# Patient Record
Sex: Male | Born: 1980 | Race: White | Hispanic: No | Marital: Single | Smoking: Current every day smoker
Health system: Southern US, Community
[De-identification: ages and names within clinical notes are randomized; demographics above are authoritative.]

---
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
## 2002-07-04 ENCOUNTER — Emergency Department (HOSPITAL_COMMUNITY): Admission: EM | Admit: 2002-07-04 | Discharge: 2002-07-05 | Payer: Self-pay | Admitting: *Deleted

## 2002-07-05 ENCOUNTER — Encounter: Payer: Self-pay | Admitting: *Deleted

## 2008-09-02 ENCOUNTER — Emergency Department (HOSPITAL_COMMUNITY): Admission: EM | Admit: 2008-09-02 | Discharge: 2008-09-03 | Payer: Self-pay | Admitting: Emergency Medicine

## 2009-01-26 ENCOUNTER — Emergency Department (HOSPITAL_COMMUNITY): Admission: EM | Admit: 2009-01-26 | Discharge: 2009-01-26 | Payer: Self-pay | Admitting: Emergency Medicine

## 2009-01-30 ENCOUNTER — Emergency Department (HOSPITAL_COMMUNITY): Admission: EM | Admit: 2009-01-30 | Discharge: 2009-01-30 | Payer: Self-pay | Admitting: Emergency Medicine

## 2010-02-16 ENCOUNTER — Emergency Department (HOSPITAL_BASED_OUTPATIENT_CLINIC_OR_DEPARTMENT_OTHER)
Admission: EM | Admit: 2010-02-16 | Discharge: 2010-02-17 | Disposition: A | Discharge status: Home / Self Care | Payer: Self-pay | Attending: Emergency Medicine | Admitting: Emergency Medicine

## 2010-02-16 ENCOUNTER — Emergency Department (INDEPENDENT_AMBULATORY_CARE_PROVIDER_SITE_OTHER): Payer: Self-pay

## 2010-02-16 DIAGNOSIS — W2203XA Walked into furniture, initial encounter: Secondary | ICD-10-CM

## 2010-02-16 DIAGNOSIS — M79609 Pain in unspecified limb: Secondary | ICD-10-CM

## 2010-02-16 DIAGNOSIS — S92919A Unspecified fracture of unspecified toe(s), initial encounter for closed fracture: Secondary | ICD-10-CM | POA: Insufficient documentation

## 2010-04-19 LAB — COMPREHENSIVE METABOLIC PANEL
AST: 20 U/L (ref 0–37)
Albumin: 4.1 g/dL (ref 3.5–5.2)
Alkaline Phosphatase: 76 U/L (ref 39–117)
Chloride: 101 mEq/L (ref 96–112)
GFR calc Af Amer: >60 mL/min (ref >60)
Potassium: 3.6 mEq/L (ref 3.5–5.1)
Total Bilirubin: 0.7 mg/dL (ref 0.3–1.2)
Total Protein: 6.9 g/dL (ref 6.0–8.3)

## 2010-04-19 LAB — URINE CULTURE
Colony Count: NO GROWTH
Culture: NO GROWTH

## 2010-04-19 LAB — URINALYSIS, ROUTINE W REFLEX MICROSCOPIC
Bilirubin Urine: NEGATIVE
Glucose, UA: NEGATIVE mg/dL
Hgb urine dipstick: NEGATIVE
Ketones, ur: NEGATIVE mg/dL
Protein, ur: NEGATIVE mg/dL

## 2010-04-19 LAB — DIFFERENTIAL
Basophils Absolute: 0 10*3/uL (ref 0.0–0.1)
Basophils Relative: 0 % (ref 0–1)
Eosinophils Relative: 3 % (ref 0–5)
Lymphocytes Relative: 27 % (ref 12–46)
Monocytes Absolute: 0.8 10*3/uL (ref 0.1–1.0)
Monocytes Relative: 8 % (ref 3–12)

## 2010-04-19 LAB — CBC
Platelets: 218 10*3/uL (ref 150–400)
WBC: 10.5 10*3/uL (ref 4.0–10.5)

## 2013-04-14 ENCOUNTER — Emergency Department (HOSPITAL_BASED_OUTPATIENT_CLINIC_OR_DEPARTMENT_OTHER)
Admission: EM | Admit: 2013-04-14 | Discharge: 2013-04-14 | Disposition: A | Discharge status: Home / Self Care | Payer: Self-pay | Attending: Emergency Medicine | Admitting: Emergency Medicine

## 2013-04-14 ENCOUNTER — Encounter (HOSPITAL_BASED_OUTPATIENT_CLINIC_OR_DEPARTMENT_OTHER): Payer: Self-pay | Admitting: Emergency Medicine

## 2013-04-14 DIAGNOSIS — F17200 Nicotine dependence, unspecified, uncomplicated: Secondary | ICD-10-CM | POA: Insufficient documentation

## 2013-04-14 DIAGNOSIS — Z792 Long term (current) use of antibiotics: Secondary | ICD-10-CM | POA: Insufficient documentation

## 2013-04-14 DIAGNOSIS — H6690 Otitis media, unspecified, unspecified ear: Secondary | ICD-10-CM | POA: Insufficient documentation

## 2013-04-14 DIAGNOSIS — H6691 Otitis media, unspecified, right ear: Secondary | ICD-10-CM

## 2013-04-14 MED ORDER — HYDROCODONE-ACETAMINOPHEN 5-325 MG PO TABS: 2.0000 | ORAL_TABLET | ORAL | Status: DC | PRN

## 2013-04-14 MED ORDER — AMOXICILLIN 500 MG PO CAPS: 500.0000 mg | ORAL_CAPSULE | Freq: Three times a day (TID) | ORAL | Status: AC

## 2013-04-14 NOTE — ED Provider Notes (Signed)
Medical screening examination/treatment/procedure(s) were performed by non-physician practitioner and as supervising physician I was immediately available for consultation/collaboration.   EKG Interpretation None        Shelda JakesScott W. Adaja Wander, MD 04/14/13 2103

## 2013-04-14 NOTE — Discharge Instructions (Signed)
Otitis Media, Adult Otitis media is redness, soreness, and swelling (inflammation) of the middle ear. Otitis media may be caused by allergies or, most commonly, by infection. Often it occurs as a complication of the common cold. SIGNS AND SYMPTOMS Symptoms of otitis media may include:  Earache.  Fever.  Ringing in your ear.  Headache.  Leakage of fluid from the ear. DIAGNOSIS To diagnose otitis media, your health care provider will examine your ear with an otoscope. This is an instrument that allows your health care provider to see into your ear in order to examine your eardrum. Your health care provider also will ask you questions about your symptoms. TREATMENT  Typically, otitis media resolves on its own within 3 5 days. Your health care provider may prescribe medicine to ease your symptoms of pain. If otitis media does not resolve within 5 days or is recurrent, your health care provider may prescribe antibiotic medicines if he or she suspects that a bacterial infection is the cause. HOME CARE INSTRUCTIONS   Take your medicine as directed until it is gone, even if you feel better after the first few days.  Only take over-the-counter or prescription medicines for pain, discomfort, or fever as directed by your health care provider.  Follow up with your health care provider as directed. SEEK MEDICAL CARE IF:  You have otitis media only in one ear or bleeding from your nose or both.  You notice a lump on your neck.  You are not getting better in 3 5 days.  You feel worse instead of better. SEEK IMMEDIATE MEDICAL CARE IF:   You have pain that is not controlled with medicine.  You have swelling, redness, or pain around your ear or stiffness in your neck.  You notice that part of your face is paralyzed.  You notice that the bone behind your ear (mastoid) is tender when you touch it. MAKE SURE YOU:   Understand these instructions.  Will watch your condition.  Will get help  right away if you are not doing well or get worse. Document Released: 10/04/2003 Document Revised: 10/19/2012 Document Reviewed: 07/26/2012 [employer] Patient Information 2014 [employer].  

## 2013-04-14 NOTE — ED Notes (Signed)
Right ear ache since 5pm

## 2013-04-14 NOTE — ED Provider Notes (Signed)
CSN: [id number]     Arrival date & time 04/14/13  1837 History   First MD Initiated Contact with Patient 04/14/13 2037     Chief Complaint  Patient presents with  . Otalgia     (Consider location/radiation/quality/duration/timing/severity/associated sxs/prior Treatment) Patient is a 33 y.o. male presenting with ear pain. The history is provided by the patient. No language interpreter was used.  Otalgia Location:  Right Quality:  Aching Severity:  Moderate Duration:  1 day Timing:  Constant Chronicity:  New Relieved by:  Nothing Associated symptoms: congestion     History reviewed. No pertinent past medical history. History reviewed. No pertinent past surgical history. No family history on file. History  Substance Use Topics  . Smoking status: Current Every Day Smoker  . Smokeless tobacco: Not on file  . Alcohol Use: Yes    Review of Systems  HENT: Positive for congestion and ear pain.   All other systems reviewed and are negative.      Allergies  Review of patient's allergies indicates no known allergies.  Home Medications   Current Outpatient Rx  Name  Route  Sig  Dispense  Refill  . amoxicillin (AMOXIL) 500 MG capsule   Oral   Take 1 capsule (500 mg total) by mouth 3 (three) times daily.   30 capsule   0   . HYDROcodone-acetaminophen (NORCO/VICODIN) 5-325 MG per tablet   Oral   Take 2 tablets by mouth every 4 (four) hours as needed.   20 tablet   0    BP 109/75  Pulse 67  Temp(Src) 98.7 F (37.1 C) (Oral)  Resp 10  Ht 5\' 9"  (1.753 m)  Wt 150 lb (68.04 kg)  BMI 22.14 kg/m2  SpO2 99% Physical Exam  Nursing note and vitals reviewed. Constitutional: He is oriented to person, place, and time. He appears well-developed and well-nourished.  HENT:  Head: Normocephalic and atraumatic.  Left Ear: External ear normal.  Nose: Nose normal.  Mouth/Throat: Oropharynx is clear and moist.  Bulging right tm  Eyes: Conjunctivae and EOM are normal. Pupils  are equal, round, and reactive to light.  Neck: Normal range of motion.  Cardiovascular: Normal rate and normal heart sounds.   Pulmonary/Chest: Effort normal.  Abdominal: Soft. He exhibits no distension.  Musculoskeletal: Normal range of motion.  Neurological: He is alert and oriented to person, place, and time.  Skin: Skin is warm.  Psychiatric: He has a normal mood and affect.    ED Course  Procedures (including critical care time) Labs Review Labs Reviewed - No data to display Imaging Review No results found.   EKG Interpretation None      MDM   Final diagnoses:  Otitis media of right ear    amoxicillian Hydrocodone.    Lonia SkinnerLeslie K LoamiSofia, PA-C 04/14/13 2059

## 2016-10-27 ENCOUNTER — Emergency Department (HOSPITAL_COMMUNITY): Payer: Self-pay

## 2016-10-27 ENCOUNTER — Encounter (HOSPITAL_COMMUNITY): Payer: Self-pay

## 2016-10-27 ENCOUNTER — Emergency Department (HOSPITAL_COMMUNITY)
Admission: EM | Admit: 2016-10-27 | Discharge: 2016-10-27 | Disposition: A | Discharge status: Home / Self Care | Payer: Self-pay | Attending: Emergency Medicine | Admitting: Emergency Medicine

## 2016-10-27 DIAGNOSIS — Z23 Encounter for immunization: Secondary | ICD-10-CM | POA: Insufficient documentation

## 2016-10-27 DIAGNOSIS — Y92410 Unspecified street and highway as the place of occurrence of the external cause: Secondary | ICD-10-CM | POA: Insufficient documentation

## 2016-10-27 DIAGNOSIS — M542 Cervicalgia: Secondary | ICD-10-CM | POA: Insufficient documentation

## 2016-10-27 DIAGNOSIS — Y9389 Activity, other specified: Secondary | ICD-10-CM | POA: Insufficient documentation

## 2016-10-27 DIAGNOSIS — F17200 Nicotine dependence, unspecified, uncomplicated: Secondary | ICD-10-CM | POA: Insufficient documentation

## 2016-10-27 DIAGNOSIS — Y999 Unspecified external cause status: Secondary | ICD-10-CM | POA: Insufficient documentation

## 2016-10-27 DIAGNOSIS — S01511A Laceration without foreign body of lip, initial encounter: Secondary | ICD-10-CM | POA: Insufficient documentation

## 2016-10-27 MED ORDER — TETANUS-DIPHTH-ACELL PERTUSSIS 5-2.5-18.5 LF-MCG/0.5 IM SUSP
0.5000 mL | Freq: Once | INTRAMUSCULAR | Status: AC
  Administered 2016-10-27: 0.5 mL via INTRAMUSCULAR
  Filled 2016-10-27: qty 0.5

## 2016-10-27 MED ORDER — LIDOCAINE HCL (PF) 1 % IJ SOLN
30.0000 mL | Freq: Once | INTRAMUSCULAR | Status: AC
  Administered 2016-10-27: 30 mL
  Filled 2016-10-27: qty 30

## 2016-10-27 MED ORDER — NAPROXEN 500 MG PO TABS: 500.0000 mg | ORAL_TABLET | Freq: Two times a day (BID) | ORAL | 0 refills | Status: DC

## 2016-10-27 MED ORDER — HYDROCODONE-ACETAMINOPHEN 5-325 MG PO TABS
1.0000 | ORAL_TABLET | ORAL | Status: AC
  Administered 2016-10-27: 1 via ORAL
  Filled 2016-10-27: qty 1

## 2016-10-27 NOTE — ED Notes (Signed)
Pt in CT at this time.
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
## 2016-10-27 NOTE — ED Notes (Addendum)
ED Provider at bedside for lac repair 

## 2016-10-27 NOTE — ED Provider Notes (Signed)
LACERATION REPAIR Performed by: Antony Madura Authorized by: Antony Madura Consent: Verbal consent obtained. Risks and benefits: risks, benefits and alternatives were discussed Consent given by: patient Patient identity confirmed: provided demographic data Prepped and Draped in normal sterile fashion Wound explored  Laceration Location: lower lip  Laceration Length: 2cm  No Foreign Bodies seen or palpated  Anesthesia: local infiltration  Local anesthetic: lidocaine 1% without epinephrine  Anesthetic total: 1 ml  Irrigation method: syringe Amount of cleaning: standard  Skin closure: 5-0 chromic  Number of sutures: 4  Technique: simple interrupted  Patient tolerance: Patient tolerated the procedure well with no immediate complications.   LACERATION REPAIR Performed by: Antony Madura Authorized by: Antony Madura Consent: Verbal consent obtained. Risks and benefits: risks, benefits and alternatives were discussed Consent given by: patient Patient identity confirmed: provided demographic data Prepped and Draped in normal sterile fashion Wound explored  Laceration Location: lower lip, [place] border  Laceration Length: 0.5cm  No Foreign Bodies seen or palpated  Anesthesia: local infiltration  Local anesthetic: lidocaine 1% without epinephrine  Anesthetic total: 0.5 ml  Irrigation method: syringe Amount of cleaning: standard  Skin closure: 5-0 chromic  Number of sutures: 1  Technique: simple interrupted  Patient tolerance: Patient tolerated the procedure well with no immediate complications.    Antony Madura, PA-C 10/27/16 2200    Linwood Dibbles, MD 10/27/16 [phone]

## 2016-10-27 NOTE — Discharge Instructions (Signed)
Suture removal in 5 days, take the medications as needed for pain

## 2016-10-27 NOTE — ED Notes (Signed)
ED Provider at bedside. 

## 2017-01-18 ENCOUNTER — Emergency Department (HOSPITAL_COMMUNITY)
Admission: EM | Admit: 2017-01-18 | Discharge: 2017-01-18 | Disposition: A | Discharge status: Home / Self Care | Payer: Self-pay | Attending: Emergency Medicine | Admitting: Emergency Medicine

## 2017-01-18 ENCOUNTER — Other Ambulatory Visit: Payer: Self-pay

## 2017-01-18 ENCOUNTER — Encounter (HOSPITAL_COMMUNITY): Payer: Self-pay | Admitting: *Deleted

## 2017-01-18 DIAGNOSIS — B9789 Other viral agents as the cause of diseases classified elsewhere: Secondary | ICD-10-CM | POA: Insufficient documentation

## 2017-01-18 DIAGNOSIS — Z79899 Other long term (current) drug therapy: Secondary | ICD-10-CM | POA: Insufficient documentation

## 2017-01-18 DIAGNOSIS — H66001 Acute suppurative otitis media without spontaneous rupture of ear drum, right ear: Secondary | ICD-10-CM | POA: Insufficient documentation

## 2017-01-18 DIAGNOSIS — F17210 Nicotine dependence, cigarettes, uncomplicated: Secondary | ICD-10-CM | POA: Insufficient documentation

## 2017-01-18 DIAGNOSIS — J069 Acute upper respiratory infection, unspecified: Secondary | ICD-10-CM | POA: Insufficient documentation

## 2017-01-18 MED ORDER — AMOXICILLIN 500 MG PO CAPS: 1000.0000 mg | ORAL_CAPSULE | Freq: Two times a day (BID) | ORAL | 0 refills | Status: DC

## 2017-01-18 MED ORDER — AMOXICILLIN 250 MG PO CAPS
1000.0000 mg | ORAL_CAPSULE | Freq: Once | ORAL | Status: AC
  Administered 2017-01-18: 1000 mg via ORAL
  Filled 2017-01-18: qty 4

## 2017-01-18 NOTE — ED Triage Notes (Signed)
Pt c/o right ear pain that started x 3 days ago with cough and sore throat

## 2017-01-18 NOTE — Discharge Instructions (Signed)
Take acetaminophen or ibuprofen as needed for pain

## 2017-01-18 NOTE — ED Provider Notes (Signed)
[facility] EMERGENCY DEPARTMENT Provider Note   CSN: [id number] Arrival date & time: 01/18/17  0135     History   Chief Complaint Chief Complaint  Patient presents with  . Otalgia    HPI Lawrence Cooper is a 37 y.o. male.  The history is provided by the patient.  He complains of right ear pain for the last 3 days, getting worse.  There is associated yellowish rhinorrhea and cough productive of sputum which is clear to pale yellow.  He denies fever or chills or sweats.  He rates pain at 8/10.  He has taken over-the-counter ibuprofen without relief.  There have been sick contacts, and he is a cigarette smoker.  History reviewed. No pertinent past medical history.  There are no active problems to display for this patient.   History reviewed. No pertinent surgical history.     Home Medications    Prior to Admission medications   Medication Sig Start Date End Date Taking? Authorizing Provider  amoxicillin (AMOXIL) 500 MG capsule Take 2 capsules (1,000 mg total) by mouth 2 (two) times daily. 01/18/17   Dione BoozeGlick, Francely Craw, MD  ibuprofen (ADVIL,MOTRIN) 200 MG tablet Take 200-400 mg by mouth every 6 (six) hours as needed (for headaches or pain).     [provider]  naproxen (NAPROSYN) 500 MG tablet Take 1 tablet (500 mg total) by mouth 2 (two) times daily. 10/27/16   Linwood DibblesKnapp, Jon, MD    Family History History reviewed. No pertinent family history.  Social History Social History   Tobacco Use  . Smoking status: Current Every Day Smoker    Packs/day: 0.50    Types: Cigarettes  . Smokeless tobacco: Never Used  Substance Use Topics  . Alcohol use: Yes  . Drug use: No     Allergies   Patient has no known allergies.   Review of Systems Review of Systems  All other systems reviewed and are negative.    Physical Exam Updated Vital Signs BP 111/67 (BP Location: Right Arm)   Pulse 65   Temp 98 F (36.7 C) (Oral)   Resp 18   Ht 5\' 10"  (1.778 m)   Wt 68 kg (150 lb)    SpO2 100%   BMI 21.52 kg/m   Physical Exam  Nursing note and vitals reviewed.  37 year old male, resting comfortably and in no acute distress. Vital signs are normal. Oxygen saturation is 100%, which is normal. Head is normocephalic and atraumatic. PERRLA, EOMI. Oropharynx is clear.  Left tympanic membrane is clear.  Right tympanic membrane is moderately erythematous with slight bulging. Neck is nontender and supple without adenopathy or JVD. Back is nontender and there is no CVA tenderness. Lungs are clear without rales, wheezes, or rhonchi. Chest is nontender. Heart has regular rate and rhythm without murmur. Abdomen is soft, flat, nontender without masses or hepatosplenomegaly and peristalsis is normoactive. Extremities have no cyanosis or edema, full range of motion is present. Skin is warm and dry without rash. Neurologic: Mental status is normal, cranial nerves are intact, there are no motor or sensory deficits.  ED Treatments / Results   Procedures Procedures (including critical care time)  Medications Ordered in ED Medications  amoxicillin (AMOXIL) capsule 1,000 mg (not administered)     Initial Impression / Assessment and Plan / ED Course  I have reviewed the triage vital signs and the nursing notes.  Right otitis media.  Underlying viral upper respiratory infection.  He is discharged with prescription for amoxicillin  and given instructions regarding otitis media and URI.  Encouraged to stop smoking.  Final Clinical Impressions(s) / ED Diagnoses   Final diagnoses:  Acute suppurative otitis media of right ear without spontaneous rupture of tympanic membrane, recurrence not specified  Viral upper respiratory tract infection    ED Discharge Orders        Ordered    amoxicillin (AMOXIL) 500 MG capsule  2 times daily     01/18/17 0609       Dione Booze, MD 01/18/17 [phone]

## 2017-09-02 ENCOUNTER — Emergency Department (HOSPITAL_COMMUNITY)
Admission: EM | Admit: 2017-09-02 | Discharge: 2017-09-02 | Disposition: A | Discharge status: Home / Self Care | Payer: Self-pay | Attending: Emergency Medicine | Admitting: Emergency Medicine

## 2017-09-02 ENCOUNTER — Other Ambulatory Visit: Payer: Self-pay

## 2017-09-02 ENCOUNTER — Encounter (HOSPITAL_COMMUNITY): Payer: Self-pay

## 2017-09-02 ENCOUNTER — Emergency Department (HOSPITAL_COMMUNITY): Payer: Self-pay

## 2017-09-02 DIAGNOSIS — F17210 Nicotine dependence, cigarettes, uncomplicated: Secondary | ICD-10-CM | POA: Insufficient documentation

## 2017-09-02 DIAGNOSIS — N50819 Testicular pain, unspecified: Secondary | ICD-10-CM | POA: Insufficient documentation

## 2017-09-02 DIAGNOSIS — N5089 Other specified disorders of the male genital organs: Secondary | ICD-10-CM | POA: Insufficient documentation

## 2017-09-02 DIAGNOSIS — N5082 Scrotal pain: Secondary | ICD-10-CM

## 2017-09-02 LAB — URINALYSIS, ROUTINE W REFLEX MICROSCOPIC
Bacteria, UA: NONE SEEN
Bilirubin Urine: NEGATIVE
Glucose, UA: NEGATIVE mg/dL
Hgb urine dipstick: NEGATIVE
KETONES UR: NEGATIVE mg/dL
Nitrite: NEGATIVE
PH: 6 (ref 5.0–8.0)
Protein, ur: NEGATIVE mg/dL
Specific Gravity, Urine: 1.02 (ref 1.005–1.030)

## 2017-09-02 MED ORDER — ACETAMINOPHEN 500 MG PO TABS
1000.0000 mg | ORAL_TABLET | Freq: Once | ORAL | Status: AC
  Administered 2017-09-02: 1000 mg via ORAL
  Filled 2017-09-02: qty 2

## 2017-09-02 MED ORDER — NAPROXEN 500 MG PO TABS: 500.0000 mg | ORAL_TABLET | Freq: Two times a day (BID) | ORAL | 0 refills | Status: DC

## 2017-09-02 NOTE — ED Provider Notes (Signed)
[facility]-EMERGENCY DEPT Provider Note   CSN: [id number] Arrival date & time: 09/02/17  1323   History   Chief Complaint Chief Complaint  Patient presents with  . Groin Swelling    HPI Lawrence Cooper is a 37 y.o. male with a past medical history significant for chronic left testicular swelling who presents for evaluation of left groin swelling x 15 years and intermittent testicular pain x 2 weeks. States he has had a prior work up at [facility] 15 years ago for the same issue. Pain is rated a 5/10 and does not radiate. Pain is 0/10 when laying flat. States the swelling is worse when he bares down or when he moves at work. Pain and swelling is relieved by laying flat. Normal bowel movements. Denies urinary symptoms, abdominal pain, constipation, diarrhea, fever, chills, history of STIs, nausea, vomiting, chest pain, SOB, redness or warmth to the testes or groin. Patient states that he is able to "push the mass in and I feel better but I want to know what it is that is causing the swelling."    No past medical history on file.  There are no active problems to display for this patient.   No past surgical history on file.      Home Medications    Prior to Admission medications   Medication Sig Start Date End Date Taking? Authorizing Provider  amoxicillin (AMOXIL) 500 MG capsule Take 2 capsules (1,000 mg total) by mouth 2 (two) times daily. Patient not taking: Reported on 09/02/2017 01/18/17   Dione Booze, MD  naproxen (NAPROSYN) 500 MG tablet Take 1 tablet (500 mg total) by mouth 2 (two) times daily. 09/02/17   Clairissa Valvano A, PA-C    Family History No family history on file.  Social History Social History   Tobacco Use  . Smoking status: Current Every Day Smoker    Packs/day: 0.50    Types: Cigarettes  . Smokeless tobacco: Never Used  Substance Use Topics  . Alcohol use: Yes  . Drug use: No     Allergies   Patient has no known  allergies.   Review of Systems Review of Systems  Constitutional: Negative.   Respiratory: Negative.   Cardiovascular: Negative.   Gastrointestinal: Negative.   Genitourinary: Positive for scrotal swelling and testicular pain. Negative for decreased urine volume, difficulty urinating, discharge, dysuria, flank pain, frequency, hematuria, penile pain, penile swelling and urgency.  Musculoskeletal: Negative.   Skin: Negative.   All other systems reviewed and are negative.    Physical Exam Updated Vital Signs BP 104/72 (BP Location: Left Arm)   Pulse 64   Temp 98.2 F (36.8 C) (Oral)   Resp 18   Ht 5\' 9"  (1.753 m)   Wt 74.8 kg   SpO2 98%   BMI 24.37 kg/m   Physical Exam  Constitutional: He appears well-developed and well-nourished. No distress.  HENT:  Head: Atraumatic.  Eyes: Pupils are equal, round, and reactive to light.  Neck: Normal range of motion. Neck supple.  Cardiovascular: Normal rate and regular rhythm.  Pulmonary/Chest: Effort normal. No respiratory distress.  Abdominal: Soft. Bowel sounds are normal. He exhibits no distension and no mass. There is no tenderness. There is no rebound and no guarding. Hernia confirmed negative in the right inguinal area.  Genitourinary: Penis normal. Cremasteric reflex is present. Right testis shows no mass, no swelling and no tenderness. Left testis shows swelling and tenderness. Left testis shows no mass. No penile erythema.  No discharge found.  Genitourinary Comments: Exam performed with nurse present. Left scrotum tenderness to palpation with swelling with standing. Normal cremasteric reflex. No erythema or lesions present. Buldge in groin able to be reduced without difficulty. No tenderness to groin when supine. No evidence of incarceration or strangulation. No erythema or warmth to the testes.   Musculoskeletal: Normal range of motion.  Lymphadenopathy: No inguinal adenopathy noted on the right or left side.  Neurological: He  is alert.  Skin: Skin is warm and dry. No rash noted. He is not diaphoretic. No erythema. No pallor.  Psychiatric: He has a normal mood and affect.  Nursing note and vitals reviewed.   ED Treatments / Results  Labs (all labs ordered are listed, but only abnormal results are displayed) Labs Reviewed  URINALYSIS, ROUTINE W REFLEX MICROSCOPIC - Abnormal; Notable for the following components:      Result Value   Leukocytes, UA SMALL (*)    All other components within normal limits    EKG None  Radiology [place] Scrotum  Result Date: 09/02/2017 CLINICAL DATA:  LEFT testicular swelling and pain for 1 week EXAM: SCROTAL ULTRASOUND DOPPLER ULTRASOUND OF THE TESTICLES TECHNIQUE: Complete ultrasound examination of the testicles, epididymis, and other scrotal structures was performed. Color and spectral Doppler ultrasound were also utilized to evaluate blood flow to the testicles. COMPARISON:  None FINDINGS: Right testicle Measurements: 5.4 x 3.2 x 3.9 cm. Normal morphology without mass or calcification. Internal blood flow present on color Doppler imaging. Left testicle Measurements: 5.2 x 3.3 x 3.9 cm. Normal morphology without mass or calcification. Internal blood flow present on color Doppler imaging, symmetric with RIGHT Right epididymis:  Normal appearance Left epididymis:  Normal appearance Hydrocele: Minimal RIGHT hydrocele especially adjacent epididymal head. Small LEFT hydrocele. Varicocele:  None definitely visualized Pulsed Doppler interrogation of both testes demonstrates normal low resistance arterial and venous waveforms bilaterally. Superior to LEFT testis, an area of slightly prominent soft tissue is identified, nonspecific in appearance, heterogeneous in echogenicity, cannot exclude hernia. IMPRESSION: Unremarkable testes and epididymi bilaterally. Slight prominent nonspecific soft tissue cranial to the LEFT testis, recommend clinical correlation to exclude hernia. Electronically Signed   By:  Ulyses SouthwardMark  Boles M.D.   On: 09/02/2017 18:38   [place] Scrotum Doppler  Result Date: 09/02/2017 CLINICAL DATA:  LEFT testicular swelling and pain for 1 week EXAM: SCROTAL ULTRASOUND DOPPLER ULTRASOUND OF THE TESTICLES TECHNIQUE: Complete ultrasound examination of the testicles, epididymis, and other scrotal structures was performed. Color and spectral Doppler ultrasound were also utilized to evaluate blood flow to the testicles. COMPARISON:  None FINDINGS: Right testicle Measurements: 5.4 x 3.2 x 3.9 cm. Normal morphology without mass or calcification. Internal blood flow present on color Doppler imaging. Left testicle Measurements: 5.2 x 3.3 x 3.9 cm. Normal morphology without mass or calcification. Internal blood flow present on color Doppler imaging, symmetric with RIGHT Right epididymis:  Normal appearance Left epididymis:  Normal appearance Hydrocele: Minimal RIGHT hydrocele especially adjacent epididymal head. Small LEFT hydrocele. Varicocele:  None definitely visualized Pulsed Doppler interrogation of both testes demonstrates normal low resistance arterial and venous waveforms bilaterally. Superior to LEFT testis, an area of slightly prominent soft tissue is identified, nonspecific in appearance, heterogeneous in echogenicity, cannot exclude hernia. IMPRESSION: Unremarkable testes and epididymi bilaterally. Slight prominent nonspecific soft tissue cranial to the LEFT testis, recommend clinical correlation to exclude hernia. Electronically Signed   By: Ulyses SouthwardMark  Boles M.D.   On: 09/02/2017 18:38    Procedures Procedures (including critical  care time)  Medications Ordered in ED Medications  acetaminophen (TYLENOL) tablet 1,000 mg (1,000 mg Oral Given 09/02/17 1847)     Initial Impression / Assessment and Plan / ED Course  I have reviewed the triage vital signs and the nursing notes as well as the past medical history.  Pertinent labs & imaging results that were available during my care of the patient were  reviewed by me and considered in my medical decision making (see chart for details).  Patient presents for evaluation of left testicular swelling. Will obtain urine and scrotal US and doplers to r/o testicular torsion, epididymitis, hernia. Urine with small leukocytes.  [place] with possible hernia in left groin. Hernia is reducible without difficulty. No indication of incarcerated or strangulated hernia. Abdomen soft and nontender without peritoneal signs. Patient without nausea, vomiting, diarrhea, rectal bleeding. Patient is afebrile, non-tachycardic, alert, oriented and nontoxic appearing. Recommend patient follow-up with primary care physician and general surgery for discussion of hernia repair if symptoms worsen. Strict return precautions given. Patient and wife voice understanding.     Final Clinical Impressions(s) / ED Diagnoses   Final diagnoses:  Scrotum pain  Testicular swelling    ED Discharge Orders         Ordered    naproxen (NAPROSYN) 500 MG tablet  2 times daily     09/02/17 1925           Zavier Canela A, PA-C 09/02/17 1950    Bethann Berkshire, MD 09/02/17 2323

## 2017-09-02 NOTE — ED Triage Notes (Signed)
He c/o left testicular swelling "for a long time now". He states recently the swelling and edema has increased. He states he has had diagnostics performed in the past for this at [facility] and no real definitive diagnosis was given. He is in no distress.

## 2017-09-02 NOTE — Discharge Instructions (Addendum)
The ultrasound of the testes showed that you might have a hernia in your lower left groin area. Please follow up with [facility] for further evaluation. Return to the ED with pain that is worse suddenly, a bulge that will not go down with pressure or laying down, nausea, redness to the testes, change in bowel movements.  Get help right away if: The area where the legs meets the lower belly has: Pain that gets worse suddenly. A bulge that gets bigger suddenly and does not go down. A bulge that turns red or purple. A bulge that is painful to the touch. You are a man and your scrotum: Suddenly feels painful. Suddenly changes in size. You feel sick to your stomach (nauseous) and this feeling does not go away. You throw up (vomit) and this keeps happening. You feel your heart beating a lot more quickly than normal. You cannot poop (have a bowel movement) or pass gas.
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
## 2020-01-12 IMAGING — US US SCROTUM W/ DOPPLER COMPLETE
1 series · 13 of 25 positions shown · non-contrast
Comparison: None

CLINICAL DATA: LEFT testicular swelling and pain for 1 week

EXAM:
SCROTAL ULTRASOUND
DOPPLER ULTRASOUND OF THE TESTICLES
TECHNIQUE: Complete ultrasound examination of the testicles, epididymis, and
other scrotal structures was performed. Color and spectral Doppler
ultrasound were also utilized to evaluate blood flow to the
testicles.

[Series 1: us scrotum w/ doppler complete · 68 acquisitions, 13 frames shown]
[im 1/68]
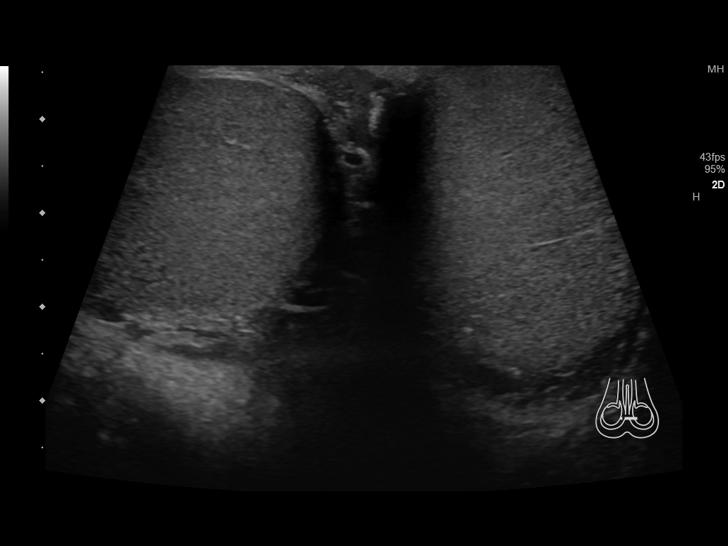
[im 6/68]
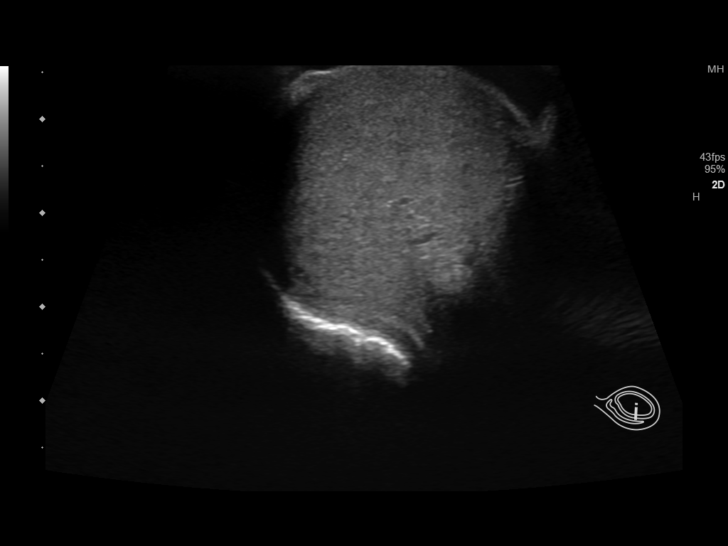
[im 12/68]
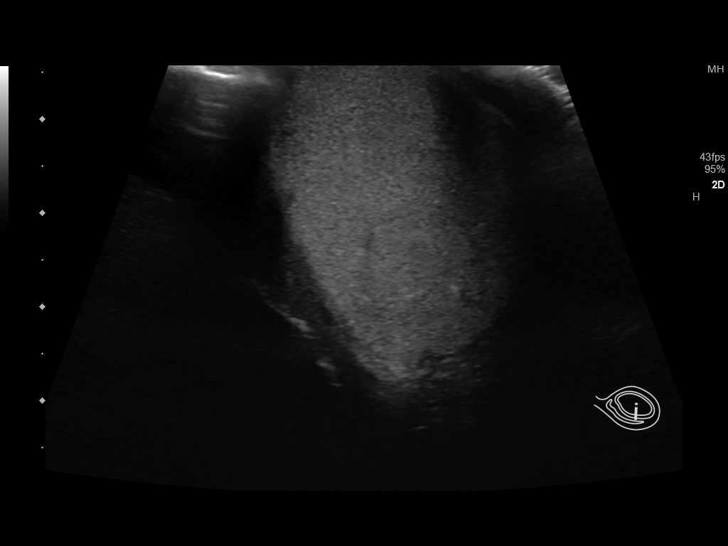
[im 17/68]
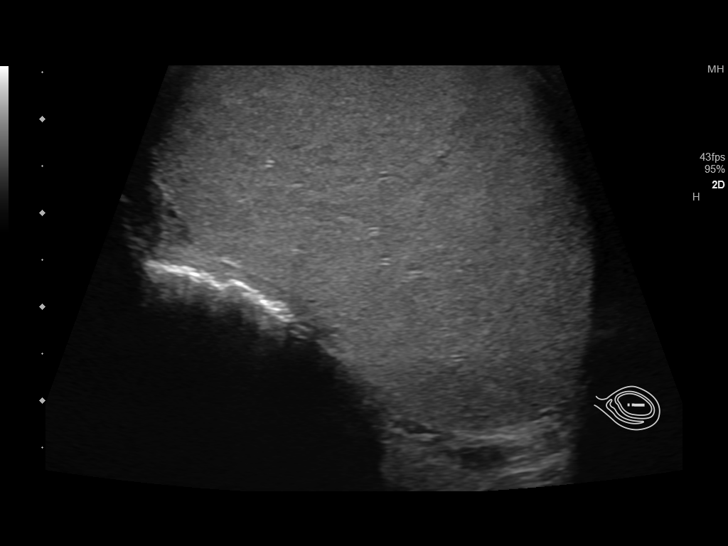
[im 23/68]
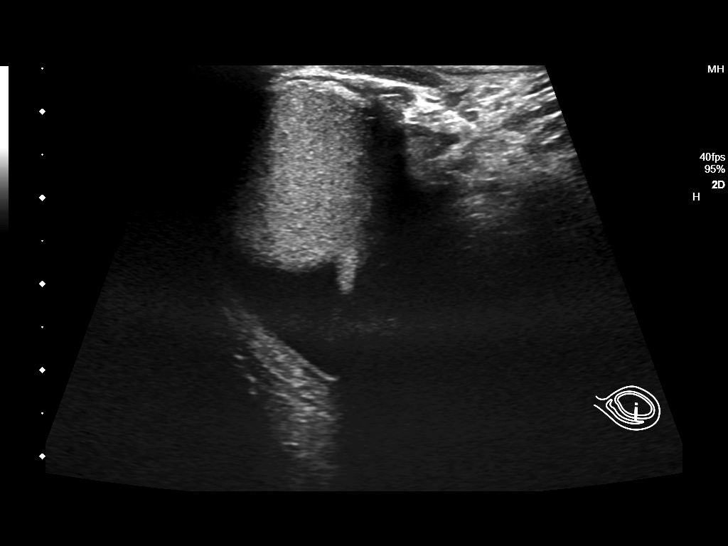
[im 28/68]
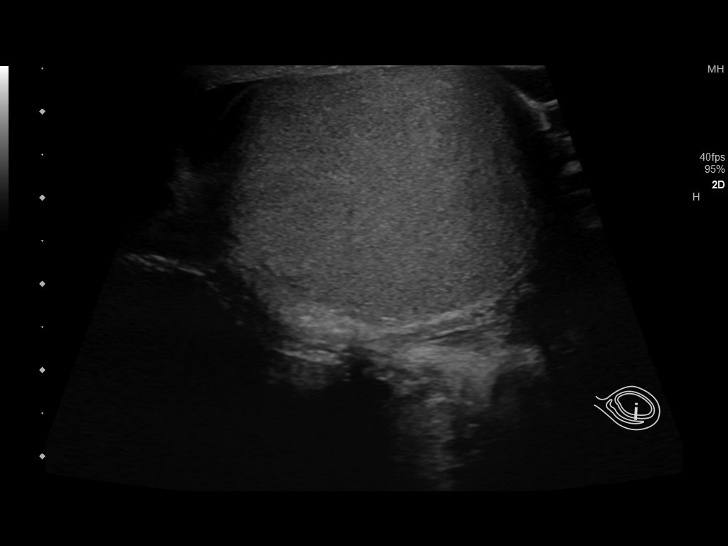
[im 34/68]
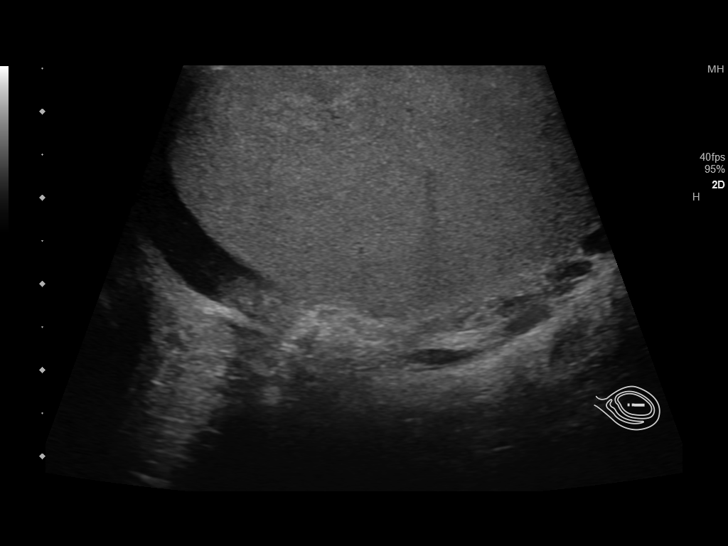
[im 40/68]
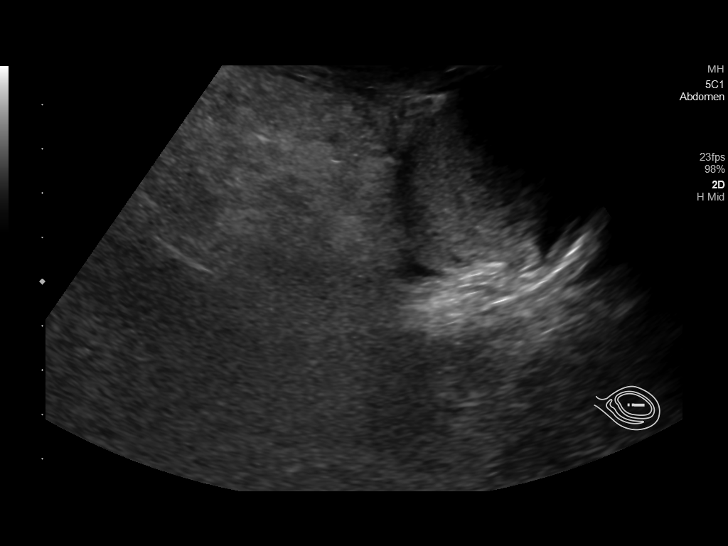
[im 45/68]
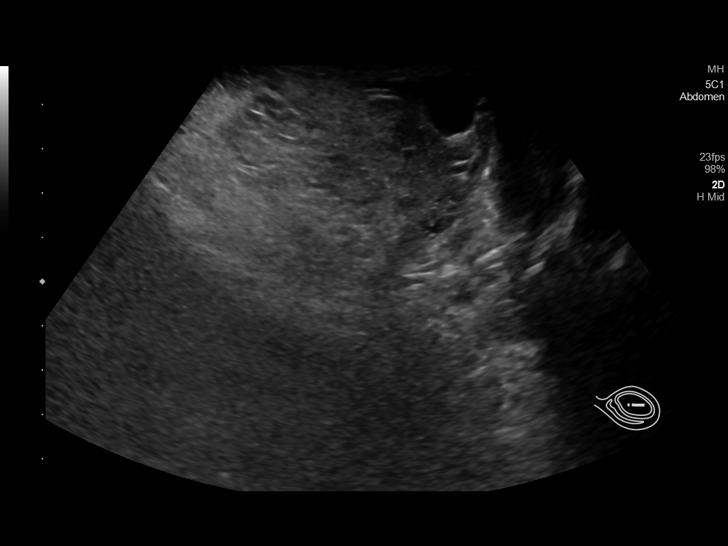
[im 51/68]
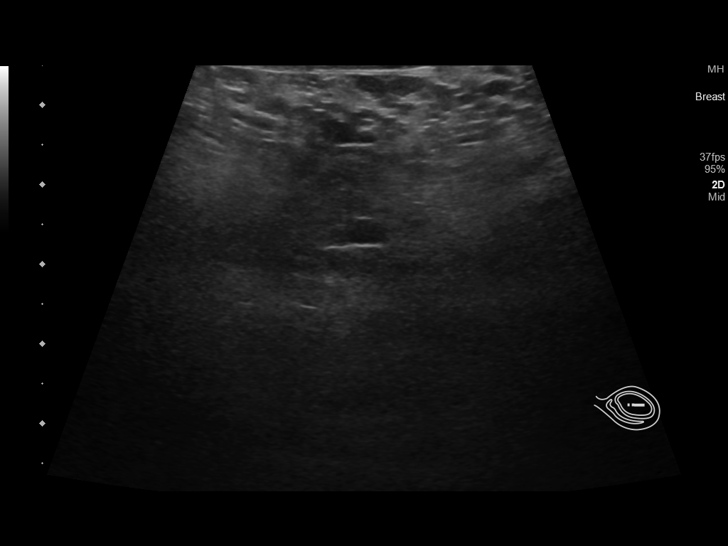
[im 56/68]
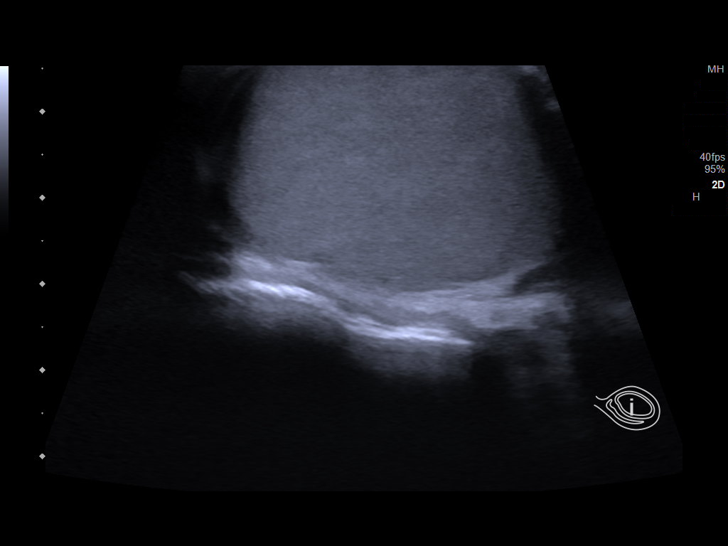
[im 62/68]
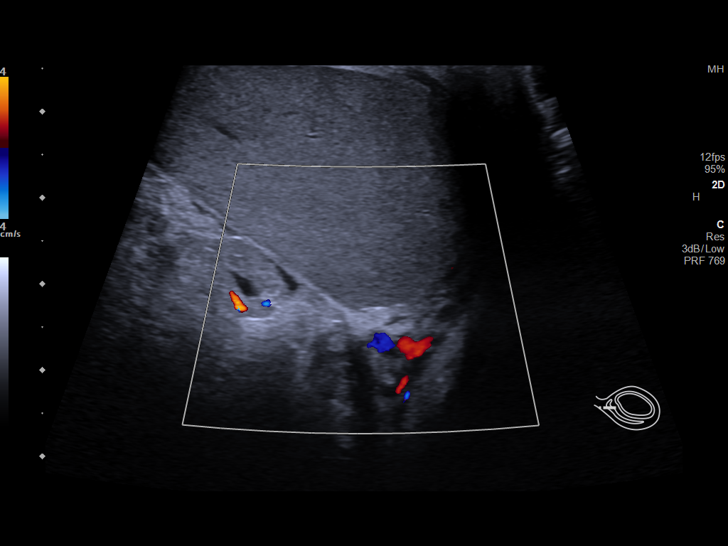
[im 68/68]
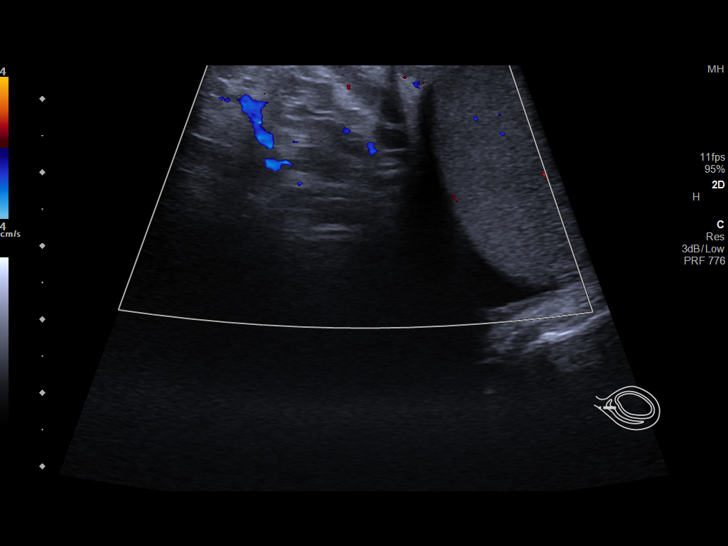

[13 of 25 positions shown; findings below may reference images not displayed]

FINDINGS: Right testicle

Measurements: 5.4 x 3.2 x 3.9 cm. Normal morphology without mass or
calcification. Internal blood flow present on color Doppler imaging.

Left testicle

Measurements: 5.2 x 3.3 x 3.9 cm. Normal morphology without mass or
calcification. Internal blood flow present on color Doppler imaging,
symmetric with RIGHT

Right epididymis:  Normal appearance

Left epididymis:  Normal appearance

Hydrocele: Minimal RIGHT hydrocele especially adjacent epididymal
head. Small LEFT hydrocele.

Varicocele:  None definitely visualized

Pulsed Doppler interrogation of both testes demonstrates normal low
resistance arterial and venous waveforms bilaterally.

Superior to LEFT testis, an area of slightly prominent soft tissue
is identified, nonspecific in appearance, heterogeneous in
echogenicity, cannot exclude hernia.
IMPRESSION: Unremarkable testes and epididymi bilaterally.

Slight prominent nonspecific soft tissue cranial to the LEFT testis,
recommend clinical correlation to exclude hernia.

## 2022-04-08 DIAGNOSIS — K219 Gastro-esophageal reflux disease without esophagitis: Secondary | ICD-10-CM | POA: Insufficient documentation

## 2022-04-08 DIAGNOSIS — R748 Abnormal levels of other serum enzymes: Secondary | ICD-10-CM | POA: Insufficient documentation

## 2022-04-08 DIAGNOSIS — E7800 Pure hypercholesterolemia, unspecified: Secondary | ICD-10-CM | POA: Insufficient documentation

## 2022-04-08 DIAGNOSIS — E039 Hypothyroidism, unspecified: Secondary | ICD-10-CM | POA: Insufficient documentation

## 2023-02-04 ENCOUNTER — Ambulatory Visit (INDEPENDENT_AMBULATORY_CARE_PROVIDER_SITE_OTHER): Payer: MEDICAID | Admitting: Podiatry

## 2023-02-04 ENCOUNTER — Ambulatory Visit (INDEPENDENT_AMBULATORY_CARE_PROVIDER_SITE_OTHER): Payer: MEDICAID

## 2023-02-04 DIAGNOSIS — M76822 Posterior tibial tendinitis, left leg: Secondary | ICD-10-CM

## 2023-02-04 DIAGNOSIS — M7752 Other enthesopathy of left foot: Secondary | ICD-10-CM | POA: Diagnosis not present

## 2023-02-04 DIAGNOSIS — M722 Plantar fascial fibromatosis: Secondary | ICD-10-CM

## 2023-02-04 DIAGNOSIS — M778 Other enthesopathies, not elsewhere classified: Secondary | ICD-10-CM

## 2023-02-04 MED ORDER — METHYLPREDNISOLONE 4 MG PO TBPK: ORAL_TABLET | ORAL | 0 refills | Status: AC

## 2023-02-04 MED ORDER — MELOXICAM 7.5 MG PO TABS: 7.5000 mg | ORAL_TABLET | Freq: Every day | ORAL | 0 refills | Status: AC

## 2023-02-04 NOTE — Progress Notes (Signed)
Chief Complaint  Patient presents with   Foot Pain    N: Pain in the back of the heel 8/10 shap with burning. Onset x 2months, no initial injury. Started working 8 hour days at [employer]. Better:being up on feet, resting makes it worse. Tylenol, no topicals tried.    HPI: 43 y.o. male presenting today with c/o pain in the bottom of the left heel.  Patient notes that he went to the emergency department for pain in the left heel.  He states that he was evaluated and told to follow-up with podiatry.  He states that no treatment was performed in the emergency department.  He also noted that no medications have been prescribed.  He works at [employer], and just started working there 3 months ago.  He has to stand on hard surfaces throughout the day.  Notes pain with this for steps out of bed in the morning  History reviewed. No pertinent past medical history.  History reviewed. No pertinent surgical history.  No Known Allergies   Physical Exam: General: The patient is alert and oriented x3 in no acute distress.  Dermatology:  No ecchymosis, erythema, or edema bilateral.  No open lesions.    Vascular: Palpable pedal pulses bilaterally. Capillary refill within normal limits.  No appreciable edema.    Neurological: Light touch sensation intact bilateral.  MMT 5/5 to lower extremity bilateral. Negative Tinel's sign with percussion of the posterior tibial nerve on the affected extremity.    Musculoskeletal Exam:  There is pain on palpation of the plantarmedial & plantarcentral aspect of left heel.  No gaps or nodules within the plantar fascia.  Positive Windlass mechanism bilateral.  Antalgic gait noted with first few steps upon standing.  No pain on palpation of achilles tendon bilateral.  Ankle df less than 10 degrees with knee extended b/l.  Mild pain on palpation of posterior tibial tendon left foot.  Radiographic Exam (left foot, 3WB views, 02/04/23):  Normal osseous mineralization.  Minimal  uneven joint space narrowing at the first MPJ.  No calcaneal spur or fracture seen.  There is an increased calcaneal inclination angle noted on lateral view.  There is adductovarus of the fourth and fifth toes noted   Assessment/Plan of Care: 1. Plantar fasciitis of left foot   2. Capsulitis of left foot   3. Posterior tibial tendinitis of left leg     Meds ordered this encounter  Medications   methylPREDNISolone (MEDROL DOSEPAK) 4 MG TBPK tablet    Sig: Take as directed    Dispense:  21 tablet    Refill:  0   meloxicam (MOBIC) 7.5 MG tablet    Sig: Take 1 tablet (7.5 mg total) by mouth daily. Start taking this after you complete your Medrol Dose pack    Dispense:  30 tablet    Refill:  0   -Reviewed etiology of plantar fasciitis with patient.  Discussed treatment options with patient today, including cortisone injection, NSAID course of treatment, stretching exercises, physical therapy, use of night splint, rest, icing the heel, arch supports/orthotics, and supportive shoe gear.    Patient was fitted for a night splint for the left foot.  This is a static AFO to be worn when sleeping or nonweightbearing.  It has a soft interface material  Patient declined power step inserts due to cost today.  Will send in prescription for Medrol Dosepak for the patient to start immediately.  Once he finishes this he can start the  prescription meloxicam 7.5 mg 1 tab p.o. daily which was also sent in today.  Patient was given stretching exercises at checkout today.  He can perform these twice daily.  Return in about 4 weeks (around 03/04/2023) for f/u plantar fasciitis.   Clerance Lav, DPM, FACFAS [facility]     [address] [place], [place] [address]                Office [phone]  Fax [phone]

## 2023-02-04 NOTE — Patient Instructions (Signed)

## 2023-02-07 ENCOUNTER — Encounter: Payer: Self-pay | Admitting: Podiatry

## 2023-02-07 DIAGNOSIS — M722 Plantar fascial fibromatosis: Secondary | ICD-10-CM | POA: Insufficient documentation

## 2023-03-03 ENCOUNTER — Ambulatory Visit (INDEPENDENT_AMBULATORY_CARE_PROVIDER_SITE_OTHER): Payer: MEDICAID | Admitting: Podiatry

## 2023-03-03 ENCOUNTER — Telehealth: Payer: Self-pay | Admitting: Urology

## 2023-03-03 DIAGNOSIS — M722 Plantar fascial fibromatosis: Secondary | ICD-10-CM

## 2023-03-03 DIAGNOSIS — M778 Other enthesopathies, not elsewhere classified: Secondary | ICD-10-CM

## 2023-03-03 NOTE — Telephone Encounter (Signed)
 Pt called stating the place that we sent him to for the inserts wants [place] to fax the rx and information to them and not the pt bring the rx to them, pt stated he would bring the rx back to the office or fax it to [place] for [place] to send to [place] with the note from todays visit.

## 2023-03-03 NOTE — Progress Notes (Unsigned)
      Chief Complaint  Patient presents with   Plantar Fasciitis    4 week fu for PF in the left foot. Came in wearing the night splint today. I did explain that it was to be worn at night and could make him fall if he wears it out. States he is about 50% better. He does wear the night splint at home and has been doing his stretches. Not diabetic and no anti coag.    HPI: 43 y.o. male presents today wearing his night splint on the left foot.  The medical assistant informed him this is only meant to be worn when he is off of his feet or sleeping.  She explained that there was no tread on the underside of the night splint and this could be a fall hazard.  Family members are with him today.  Patient notes that he is approximately 50% better but still does have pain in the bottom of the left heel.  History reviewed. No pertinent past medical history.  History reviewed. No pertinent surgical history.  No Known Allergies   Physical Exam: Palpable pedal pulses are noted.  No ecchymosis is seen.  No rubor is noted.  No open lesions are noted.  There is pain on palpation to the plantar medial aspect of the left heel.  The medial and central plantar fascial bands are tight on palpation.  No pain along the Achilles tendon.  Negative Tinel's sign with percussion of the posterior tibial nerve.  Assessment/Plan of Care: 1. Plantar fasciitis of left foot   2. Capsulitis of left foot     Discussed clinical findings with patient today.  With the patient's verbal consent, a corticosteroid injection was adminstered to the left heel.  This consisted of a mixture of 1% lidocaine plain, 0.5% sensorcaine plain, and Kenalog-10 for a total of 1.25cc's administered.  Bandaid applied. Patient tolerated this well.     Patient was given a prescription for custom orthotics to be filled at [facility].  He will call to schedule an evaluation/casting at his earliest convenience.  Continue with the  night splint for sleeping or when sitting for prolonged periods of time.  Follow-up as needed   Clerance Lav, DPM, FACFAS [facility]     [address] [place], [place] [address]                Office [phone]  Fax [phone]

## 2023-03-04 ENCOUNTER — Encounter: Payer: Self-pay | Admitting: Podiatry
